# Patient Record
Sex: Male | Born: 1975 | Race: White | Hispanic: No | Smoking: Current every day smoker
Health system: Southern US, Community
[De-identification: ages and names within clinical notes are randomized; demographics above are authoritative.]

## PROBLEM LIST (undated history)

## (undated) DIAGNOSIS — M1990 Unspecified osteoarthritis, unspecified site: Secondary | ICD-10-CM

## (undated) HISTORY — DX: Unspecified osteoarthritis, unspecified site: M19.90

---

## 1998-09-05 ENCOUNTER — Encounter: Payer: Self-pay | Admitting: *Deleted

## 1998-09-05 ENCOUNTER — Emergency Department (HOSPITAL_COMMUNITY): Admission: EM | Admit: 1998-09-05 | Discharge: 1998-09-05 | Payer: Self-pay | Admitting: *Deleted

## 2000-08-21 ENCOUNTER — Emergency Department (HOSPITAL_COMMUNITY): Admission: EM | Admit: 2000-08-21 | Discharge: 2000-08-21 | Payer: Self-pay | Admitting: Emergency Medicine

## 2009-09-24 ENCOUNTER — Emergency Department (HOSPITAL_COMMUNITY): Admission: EM | Admit: 2009-09-24 | Discharge: 2009-09-24 | Payer: Self-pay | Admitting: Emergency Medicine

## 2017-10-27 NOTE — Progress Notes (Deleted)
   Subjective: WU:JWJXBJYNWCC:establish care, *** HPI: Anthony BrunnerJohnny Richardson is a 42 y.o. male presenting to clinic today for:  1. ***  No past medical history on file. *** The histories are not reviewed yet. Please review them in the "History" navigator section and refresh this SmartLink. Social History   Socioeconomic History  . Marital status: Not on file    Spouse name: Not on file  . Number of children: Not on file  . Years of education: Not on file  . Highest education level: Not on file  Social Needs  . Financial resource strain: Not on file  . Food insecurity - worry: Not on file  . Food insecurity - inability: Not on file  . Transportation needs - medical: Not on file  . Transportation needs - non-medical: Not on file  Occupational History  . Not on file  Tobacco Use  . Smoking status: Not on file  Substance and Sexual Activity  . Alcohol use: Not on file  . Drug use: Not on file  . Sexual activity: Not on file  Other Topics Concern  . Not on file  Social History Narrative  . Not on file   No outpatient medications have been marked as taking for the 10/29/17 encounter (Appointment) with Raliegh IpGottschalk, Jered Heiny M, DO.   No family history on file. Allergies not on file   Health Maintenance: ***  Flu Vaccine: {YES/NO/WILD GNFAO:13086}CARDS:18581}  Tdap Vaccine: {YES/NO/WILD VHQIO:96295}CARDS:18581}  - every 4297yrs - (<3 lifetime doses or unknown): all wounds -- look up need for Tetanus IG - (>=3 lifetime doses): clean/minor wound if >497yrs from previous; all other wounds if >3944yrs from previous Zoster Vaccine: {YES/NO/WILD CARDS:18581} (those >50yo, once) Pneumonia Vaccine: {YES/NO/WILD MWUXL:24401}CARDS:18581} (those w/ risk factors) - (<2120yr) Both: Immunocompromised, cochlear implant, CSF leak, asplenic, sickle cell, Chronic Renal Failure - (<1420yr) PPSV-23 only: Heart dz, lung disease, DM, tobacco abuse, alcoholism, cirrhosis/liver disease. - (>6920yr): PPSV13 then PPSV23 in 6-12mths;  - (>6320yr): repeat PPSV23 once if  pt received prior to 40yo and 144yrs have passed  ROS: Per HPI  Objective: Office vital signs reviewed. There were no vitals taken for this visit.  Physical Examination:  General: Awake, alert, *** nourished, No acute distress HEENT: Normal    Neck: No masses palpated. No lymphadenopathy    Ears: Tympanic membranes intact, normal light reflex, no erythema, no bulging    Eyes: PERRLA, extraocular movement in tact, sclera ***    Nose: nasal turbinates moist, *** nasal discharge    Throat: moist mucus membranes, no erythema, *** tonsillar exudate.  Airway is patent Cardio: regular rate and rhythm, S1S2 heard, no murmurs appreciated Pulm: clear to auscultation bilaterally, no wheezes, rhonchi or rales; normal work of breathing on room air GI: soft, non-tender, non-distended, bowel sounds present x4, no hepatomegaly, no splenomegaly, no masses GU: external vaginal tissue ***, cervix ***, *** punctate lesions on cervix appreciated, *** discharge from cervical os, *** bleeding, *** cervical motion tenderness, *** abdominal/ adnexal masses Extremities: warm, well perfused, No edema, cyanosis or clubbing; +*** pulses bilaterally MSK: *** gait and *** station Skin: dry; intact; no rashes or lesions Neuro: *** Strength and light touch sensation grossly intact, *** DTRs ***/4  Assessment/ Plan: 42 y.o. male   No problem-specific Assessment & Plan notes found for this encounter.   Raliegh IpAshly M Torey Reinard, DO [facility] [phone]

## 2017-10-29 ENCOUNTER — Ambulatory Visit: Payer: BLUE CROSS/BLUE SHIELD | Admitting: Family Medicine

## 2017-10-29 ENCOUNTER — Ambulatory Visit: Payer: Self-pay | Admitting: Family Medicine

## 2017-10-29 ENCOUNTER — Encounter: Payer: Self-pay | Admitting: Family Medicine

## 2017-10-29 VITALS — BP 134/90 | HR 87 | Temp 97.8°F | Ht 71.0 in | Wt 188.0 lb

## 2017-10-29 DIAGNOSIS — Z7689 Persons encountering health services in other specified circumstances: Secondary | ICD-10-CM

## 2017-10-29 DIAGNOSIS — M1711 Unilateral primary osteoarthritis, right knee: Secondary | ICD-10-CM | POA: Diagnosis not present

## 2017-10-29 DIAGNOSIS — M25551 Pain in right hip: Secondary | ICD-10-CM

## 2017-10-29 DIAGNOSIS — Z720 Tobacco use: Secondary | ICD-10-CM | POA: Insufficient documentation

## 2017-10-29 DIAGNOSIS — G479 Sleep disorder, unspecified: Secondary | ICD-10-CM | POA: Diagnosis not present

## 2017-10-29 DIAGNOSIS — Z23 Encounter for immunization: Secondary | ICD-10-CM | POA: Diagnosis not present

## 2017-10-29 MED ORDER — TRAZODONE HCL 50 MG PO TABS: 25.0000 mg | ORAL_TABLET | Freq: Every evening | ORAL | 0 refills | Status: DC | PRN

## 2017-10-29 MED ORDER — NAPROXEN 500 MG PO TABS: 500.0000 mg | ORAL_TABLET | Freq: Two times a day (BID) | ORAL | 0 refills | Status: DC

## 2017-10-29 NOTE — Assessment & Plan Note (Signed)
We will plan to initiate Chantix after he has been evaluated by sleep medicine.

## 2017-10-29 NOTE — Progress Notes (Signed)
Subjective: JX:BJYNWGNFACC:establish care, RLE pain HPI: Anthony Richardson is a 42 y.o. male presenting to clinic today for:  1. RLE pain Patient [facility] for aute on chronic Right sided hip and knee pain.  He notes that this has been present for 1 year.  It got worse over the last week.  He went to ED on Friday and was evaluated, had xrays done and was told he had OA of right knee and hip.  He was discharged with Norco and Naproxen.  He reports that pain has gotten a lot better.  He is now able to walk around without pain.  He notes that pain is still present in right hip.  He reports that pain feels like a stabbing pain in his knee and a grinding pain in the right hip.  No h/o injury to that area.  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  He notes pain is most severe in the morning.  Tolerating medication without difficulty.  2. Sleep difficulty Patient reports long-standing history of sleep difficulty.  He notes that he was previously prescribed trazodone which did help for about a month.  He was later switched to Ambien which was not helpful at all.  He reports difficulty falling asleep and staying asleep, citing that he gets may be about 3 hours of sleep per night.  He reports daytime sleepiness.  He denies nocturia, snoring, pauses in breathing.  He does drink 1-2 beers per night.  Avoids television and other blue light activities prior to bedtime.  No significant anxiety type symptoms.  3.  Tobacco use disorder Patient reports that he smokes about a pack per day and has done so for about 20 years.  Denies unplanned weight loss, hemoptysis, shortness of breath.  He notes that he was able to stop smoking for about 6 months but resumed and has not been able to stop again.  He has used nicotine patches, nicotine gum in the past.  He has never used Wellbutrin or Chantix in the past.  He is interested in quitting.  Past Medical  History:  Diagnosis Date  . Arthritis    History reviewed. No pertinent surgical history. Social History   Socioeconomic History  . Marital status: Divorced    Spouse name: Not on file  . Number of children: Not on file  . Years of education: Not on file  . Highest education level: Not on file  Social Needs  . Financial resource strain: Not on file  . Food insecurity - worry: Not on file  . Food insecurity - inability: Not on file  . Transportation needs - medical: Not on file  . Transportation needs - non-medical: Not on file  Occupational History  . Not on file  Tobacco Use  . Smoking status: Current Every Day Smoker    Packs/day: 1.00    Years: 15.00    Pack years: 15.00    Types: Cigarettes  . Smokeless tobacco: Never Used  Substance and Sexual Activity  . Alcohol use: Yes    Alcohol/week: 4.8 oz    Types: 8 Cans of beer per week  . Drug use: No  . Sexual activity: Not on file  Other Topics Concern  . Not on file  Social History Narrative  . Not on file   No outpatient medications have been marked as taking for the 10/29/17 encounter (Office Visit)  with Raliegh Ip, DO.   Family History  Problem Relation Age of Onset  . Arthritis Mother   . Cancer Father 55       penile  . ADD / ADHD Daughter   . ADD / ADHD Son   . Dementia Maternal Grandmother   . Heart attack Maternal Grandfather 50       x3, died in 11s  . Alcohol abuse Maternal Grandfather   . Dementia Paternal Grandmother   . COPD Paternal Grandfather    No Known Allergies   Health Maintenance: Flu Vaccine: yes ; Tdap Vaccine: yes    ROS: Per HPI  Objective: Office vital signs reviewed. BP 134/90   Pulse 87   Temp 97.8 F (36.6 C) (Oral)   Ht 5\' 11"  (1.803 m)   Wt 188 lb (85.3 kg)   BMI 26.22 kg/m   Physical Examination:  General: Awake, alert, No acute distress HEENT: Normal    Eyes: PERRLA, extraocular movement in tact, sclera white    Throat: moist mucus membranes, poor  dentition Cardio: regular rate and rhythm, S1S2 heard, no murmurs appreciated Pulm: clear to auscultation bilaterally, no wheezes, rhonchi or rales; normal work of breathing on room air MSK: Ambulates independently.  Gait normal.  Right hip: Patient has limited active range of motion in internal and external rotation of the hip.  No tenderness to palpation over the greater trochanter. + Pain with FABER.   Right knee: Patient has full, painless active range of motion in all planes.  Mild tenderness to palpation along the entire joint line.  No effusion appreciated.  No palpable bony abnormalities.  No palpable masses or abnormalities within the posterior popliteal fossa.  No ligamentous laxity. Neuro: 5/5 lower extremity strength.  Light touch sensation grossly intact.  No focal neurologic deficits  Assessment/ Plan: 42 y.o. male   Sleep difficulties Seems to have fairly good sleep hygiene.  He does drink beers before bedtime which may be contributing.  Trazodone has helped some in the past, but patient never really got more than 4 5 hours of sleep per night.  Body habitus does not suggest OSA, nor does patient endorse any symptoms or signs of OSA.  We will start this at a low dose.  I did recommend that he not drink alcohol while on this medication.  We will send him for sleep study to further evaluate.  Right hip pain Recently evaluated at [facility] emergency department.  X-ray report did not demonstrate any acute bony abnormalities.  Films were not available for personal review.  His physical exam was remarkable for pain with external rotation of the right hip. ?  Labral pathology versus impingement.  Okay to continue naproxen twice daily as needed for now.  Will refer to orthopedist for further evaluation and management.  Consider intra-articular injections versus surgery if clinically appropriate.  Primary osteoarthritis of right knee X-ray from [facility] emergency department with  notes of tricompartment degenerative changes within the right knee.  Imaging studies not available for personal review.  Will refer to orthopedics for further evaluation and management.  For now, continue naproxen twice daily as needed.  Tobacco abuse We will plan to initiate Chantix after he has been evaluated by sleep medicine.   Raliegh Ip, DO [facility] [phone]

## 2017-10-29 NOTE — Assessment & Plan Note (Signed)
Seems to have fairly good sleep hygiene.  He does drink beers before bedtime which may be contributing.  Trazodone has helped some in the past, but patient never really got more than 4 5 hours of sleep per night.  Body habitus does not suggest OSA, nor does patient endorse any symptoms or signs of OSA.  We will start this at a low dose.  I did recommend that he not drink alcohol while on this medication.  We will send him for sleep study to further evaluate.

## 2017-10-29 NOTE — Patient Instructions (Addendum)
Plan to see sleep specialist.  Pending their assessment, we will discuss starting Chantix. Try and self wean.  I have sent in Trazodone for help with sleep.  Do not drink with this medication.  I have referred you to Orthopedist for your hip/ knee.  I have refilled the Naproxen.  Take this with food.  Remember to bring your labs by to scan into your chart.  Insomnia Insomnia is a sleep disorder that makes it difficult to fall asleep or to stay asleep. Insomnia can cause tiredness (fatigue), low energy, difficulty concentrating, mood swings, and poor performance at work or school. There are three different ways to classify insomnia:  Difficulty falling asleep.  Difficulty staying asleep.  Waking up too early in the morning.  Any type of insomnia can be long-term (chronic) or short-term (acute). Both are common. Short-term insomnia usually lasts for three months or less. Chronic insomnia occurs at least three times a week for longer than three months. What are the causes? Insomnia may be caused by another condition, situation, or substance, such as:  Anxiety.  Certain medicines.  Gastroesophageal reflux disease (GERD) or other gastrointestinal conditions.  Asthma or other breathing conditions.  Restless legs syndrome, sleep apnea, or other sleep disorders.  Chronic pain.  Menopause. This may include hot flashes.  Stroke.  Abuse of alcohol, tobacco, or illegal drugs.  Depression.  Caffeine.  Neurological disorders, such as Alzheimer disease.  An overactive thyroid (hyperthyroidism).  The cause of insomnia may not be known. What increases the risk? Risk factors for insomnia include:  Gender. Women are more commonly affected than men.  Age. Insomnia is more common as you get older.  Stress. This may involve your professional or personal life.  Income. Insomnia is more common in people with lower income.  Lack of exercise.  Irregular work schedule or night  shifts.  Traveling between different time zones.  What are the signs or symptoms? If you have insomnia, trouble falling asleep or trouble staying asleep is the main symptom. This may lead to other symptoms, such as:  Feeling fatigued.  Feeling nervous about going to sleep.  Not feeling rested in the morning.  Having trouble concentrating.  Feeling irritable, anxious, or depressed.  How is this treated? Treatment for insomnia depends on the cause. If your insomnia is caused by an underlying condition, treatment will focus on addressing the condition. Treatment may also include:  Medicines to help you sleep.  Counseling or therapy.  Lifestyle adjustments.  Follow these instructions at home:  Take medicines only as directed by your health care provider.  Keep regular sleeping and waking hours. Avoid naps.  Keep a sleep diary to help you and your health care provider figure out what could be causing your insomnia. Include: ? When you sleep. ? When you wake up during the night. ? How well you sleep. ? How rested you feel the next day. ? Any side effects of medicines you are taking. ? What you eat and drink.  Make your bedroom a comfortable place where it is easy to fall asleep: ? Put up shades or special blackout curtains to block light from outside. ? Use a white noise machine to block noise. ? Keep the temperature cool.  Exercise regularly as directed by your health care provider. Avoid exercising right before bedtime.  Use relaxation techniques to manage stress. Ask your health care provider to suggest some techniques that may work well for you. These may include: ? Breathing exercises. ?  Routines to release muscle tension. ? Visualizing peaceful scenes.  Cut back on alcohol, caffeinated beverages, and cigarettes, especially close to bedtime. These can disrupt your sleep.  Do not overeat or eat spicy foods right before bedtime. This can lead to digestive discomfort  that can make it hard for you to sleep.  Limit screen use before bedtime. This includes: ? Watching TV. ? Using your smartphone, tablet, and computer.  Stick to a routine. This can help you fall asleep faster. Try to do a quiet activity, brush your teeth, and go to bed at the same time each night.  Get out of bed if you are still awake after 15 minutes of trying to sleep. Keep the lights down, but try reading or doing a quiet activity. When you feel sleepy, go back to bed.  Make sure that you drive carefully. Avoid driving if you feel very sleepy.  Keep all follow-up appointments as directed by your health care provider. This is important. Contact a health care provider if:  You are tired throughout the day or have trouble in your daily routine due to sleepiness.  You continue to have sleep problems or your sleep problems get worse. Get help right away if:  You have serious thoughts about hurting yourself or someone else. This information is not intended to replace advice given to you by your health care provider. Make sure you discuss any questions you have with your health care provider. Document Released: 10/06/2000 Document Revised: 03/10/2016 Document Reviewed: 07/10/2014 Elsevier Interactive Patient Education  [employer].

## 2017-10-29 NOTE — Assessment & Plan Note (Signed)
[facility] with notes of tricompartment degenerative changes within the right knee.  Imaging studies not available for personal review.  Will refer to orthopedics for further evaluation and management.  For now, continue naproxen twice daily as needed.

## 2017-10-29 NOTE — Assessment & Plan Note (Addendum)
[facility].  X-ray report did not demonstrate any acute bony abnormalities.  Films were not available for personal review.  His physical exam was remarkable for pain with external rotation of the right hip. ?  Labral pathology versus impingement.  Okay to continue naproxen twice daily as needed for now.  Will refer to orthopedist for further evaluation and management.  Consider intra-articular injections versus surgery if clinically appropriate.

## 2017-11-22 ENCOUNTER — Ambulatory Visit (INDEPENDENT_AMBULATORY_CARE_PROVIDER_SITE_OTHER): Payer: BLUE CROSS/BLUE SHIELD | Admitting: Orthopaedic Surgery

## 2017-11-23 ENCOUNTER — Encounter (INDEPENDENT_AMBULATORY_CARE_PROVIDER_SITE_OTHER): Payer: Self-pay | Admitting: Orthopaedic Surgery

## 2017-11-23 ENCOUNTER — Ambulatory Visit (INDEPENDENT_AMBULATORY_CARE_PROVIDER_SITE_OTHER): Payer: BLUE CROSS/BLUE SHIELD

## 2017-11-23 ENCOUNTER — Ambulatory Visit (INDEPENDENT_AMBULATORY_CARE_PROVIDER_SITE_OTHER): Payer: BLUE CROSS/BLUE SHIELD | Admitting: Orthopaedic Surgery

## 2017-11-23 ENCOUNTER — Other Ambulatory Visit (INDEPENDENT_AMBULATORY_CARE_PROVIDER_SITE_OTHER): Payer: Self-pay

## 2017-11-23 VITALS — BP 147/85 | HR 94 | Ht 71.0 in | Wt 188.0 lb

## 2017-11-23 DIAGNOSIS — M25551 Pain in right hip: Secondary | ICD-10-CM | POA: Diagnosis not present

## 2017-11-23 DIAGNOSIS — G8929 Other chronic pain: Secondary | ICD-10-CM | POA: Diagnosis not present

## 2017-11-23 DIAGNOSIS — M2550 Pain in unspecified joint: Secondary | ICD-10-CM

## 2017-11-23 DIAGNOSIS — M545 Low back pain: Secondary | ICD-10-CM | POA: Diagnosis not present

## 2017-11-23 NOTE — Progress Notes (Signed)
Office Visit Note/orthopedic consultation   Patient: Anthony Richardson           Date of Birth: 03-01-1976           MRN: [id number] Visit Date: 11/23/2017              Requested by: Raliegh IpGottschalk, Ashly M, DO [address] [place], [place] [id number] PCP: Raliegh IpGottschalk, Ashly M, DO   Assessment & Plan: Visit Diagnoses:  1. Chronic right-sided low back pain, with sciatica presence unspecified   2. Right hip pain     Plan: I discussed with the patient is difficult to determine what exact problem is.  We will obtain an arthritis panel today check him back again in 6 days For review of lab work.  Place him on a prednisone Dosepak.  He may have some hip tendinitis on the right side or possibly some lumbar pathology but several findings on his physical exam suggest neither are the problem.  He can continue the medications he is taking.  I appreciate the opportunity to see him in consultation Follow-Up Instructions: Return in about 6 days (around 11/29/2017).   Orders:  Orders Placed This Encounter  Procedures  . XR Lumbar Spine 2-3 Views   No orders of the defined types were placed in this encounter.     Procedures: No procedures performed   Clinical Data: No additional findings.   Subjective: Chief Complaint  Patient presents with  . Right Leg - Pain    HPI 42 year old male with several month history of right anterior groin pain right knee pain and some pain in his great toe just in the morning.  He states his work capacity is significantly gone down.  He is here for consultation at the request of Dr. Nadine CountsGottschalk.  Patient been treated with tramadol, Flexeril, meloxicam.  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  He states he has had some back pain off and on.  He ambulates with a limp and points to the right anterior groin where he is having pain.  It bothers him at night he said difficulty sleeping.  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  Review of Systems positive for mild knee degenerative changes on recent knee x-ray, tobacco use, sleep difficulty right hip pain without history of acute injury.  Negative for rheumatologic conditions.   Objective: Vital Signs: BP (!) 147/85   Pulse 94   Ht 5\' 11"  (1.803 m)   Wt 188 lb (85.3 kg)   BMI 26.22 kg/m   Physical Exam  Constitutional: He is oriented to person, place, and time. He appears well-developed and well-nourished.  HENT:  Head: Normocephalic and atraumatic.  Eyes: EOM are normal. Pupils are equal, round, and reactive to light.  Neck: No tracheal deviation present. No thyromegaly present.  Cardiovascular: Normal rate.  Pulmonary/Chest: Effort normal. He has no wheezes.  Abdominal: Soft. Bowel sounds are normal.  Neurological: He is alert and oriented to person, place, and time.  Skin: Skin is warm and dry. Capillary refill takes less than 2 seconds.  Psychiatric: He has a normal mood and affect. His behavior is normal. Judgment and thought content normal.    Ortho Exam patient has negative straight leg raising 90 degrees and in the exam room sitting on the exam table he keeps his knee extended.  He complains of some pain with supine straight leg raising on  the right hip.  He has some resistance with attempts at internal and external rotation but can internally rotate 30 degrees right hip with pain Rotate 45 degrees with pain.  Similar findings in the supine position with hips flexed.  No quad or calf atrophy no abductor weakness no sensory deficit.  Knee jerk is 3+ and ankle jerk is trace and symmetrical.  Anterior tib EHL gastrocsoleus are strong.  Distal pulses are intact.  Peroneals posterior tib are strong.  He has tenderness with palpation lumbar spine L4-5 and L5-S1 interspace and spinous processes.  Some sciatic notch tenderness on the right.  No inguinal lymphadenopathy.  No hip flexion weakness.  Specialty Comments:  No  specialty comments available.  Imaging: Xr Lumbar Spine 2-3 Views  Result Date: 11/23/2017 AP lateral lumbar spine x-rays demonstrate normal anatomy negative for acute changes.  Maintained disc space height.  No curvature. Impression:  normal lumbar spine x-rays.    PMFS History: Patient Active Problem List   Diagnosis Date Noted  . Tobacco abuse 10/29/2017  . Sleep difficulties 10/29/2017  . Primary osteoarthritis of right knee 10/29/2017  . Right hip pain 10/29/2017   Past Medical History:  Diagnosis Date  . Arthritis     Family History  Problem Relation Age of Onset  . Arthritis Mother   . Cancer Father 55       penile  . ADD / ADHD Daughter   . ADD / ADHD Son   . Dementia Maternal Grandmother   . Heart attack Maternal Grandfather 50       x3, died in 58s  . Alcohol abuse Maternal Grandfather   . Dementia Paternal Grandmother   . COPD Paternal Grandfather     No past surgical history on file. Social History   Occupational History  . Not on file  Tobacco Use  . Smoking status: Current Every Day Smoker    Packs/day: 1.00    Years: 15.00    Pack years: 15.00    Types: Cigarettes  . Smokeless tobacco: Never Used  Substance and Sexual Activity  . Alcohol use: Yes    Alcohol/week: 4.8 oz    Types: 8 Cans of beer per week  . Drug use: No  . Sexual activity: Yes    Birth control/protection: Condom

## 2017-11-26 LAB — SEDIMENTATION RATE: SED RATE: 2 mm/h (ref 0–15)

## 2017-11-26 LAB — RHEUMATOID FACTOR

## 2017-11-26 LAB — URIC ACID: URIC ACID, SERUM: 5.2 mg/dL (ref 4.0–8.0)

## 2017-11-26 LAB — ANA: Anti Nuclear Antibody(ANA): NEGATIVE

## 2017-11-29 ENCOUNTER — Ambulatory Visit (INDEPENDENT_AMBULATORY_CARE_PROVIDER_SITE_OTHER): Payer: BLUE CROSS/BLUE SHIELD | Admitting: Surgery

## 2017-12-17 ENCOUNTER — Other Ambulatory Visit: Payer: Self-pay | Admitting: Family Medicine

## 2017-12-19 ENCOUNTER — Ambulatory Visit: Payer: BLUE CROSS/BLUE SHIELD | Admitting: Family Medicine

## 2017-12-19 ENCOUNTER — Encounter: Payer: Self-pay | Admitting: Family Medicine

## 2017-12-19 VITALS — BP 138/88 | HR 97 | Temp 97.4°F | Ht 71.0 in | Wt 182.0 lb

## 2017-12-19 DIAGNOSIS — M5441 Lumbago with sciatica, right side: Secondary | ICD-10-CM | POA: Diagnosis not present

## 2017-12-19 DIAGNOSIS — G8929 Other chronic pain: Secondary | ICD-10-CM | POA: Diagnosis not present

## 2017-12-19 MED ORDER — CYCLOBENZAPRINE HCL 10 MG PO TABS: 5.0000 mg | ORAL_TABLET | Freq: Three times a day (TID) | ORAL | 0 refills | Status: DC | PRN

## 2017-12-19 MED ORDER — NAPROXEN 500 MG PO TABS: 500.0000 mg | ORAL_TABLET | Freq: Two times a day (BID) | ORAL | 0 refills | Status: DC

## 2017-12-19 MED ORDER — TRAMADOL HCL 50 MG PO TABS: 50.0000 mg | ORAL_TABLET | Freq: Two times a day (BID) | ORAL | 0 refills | Status: DC | PRN

## 2017-12-19 NOTE — Progress Notes (Signed)
Subjective: CC: Right hip/ knee pain HPI: Anthony Richardson is a 42 y.o. male presenting to clinic today for:  1. Right hip and knee pain Patient was seen by orthopedics on 11/23/2017.  He was scheduled to follow-up 1 week later but apparently no showed to the appointment.  He follows up today and notes that he was dissatisfied with his evaluation at the orthopedics office.  He notes that he continues to have right-sided hip and low back pain with radiation to the right knee.  He has intermittent numbness and tingling in the leg with walking.  Denies any urinary retention, fecal incontinence or saddle anesthesia.  He reports the pain was somewhat improved with the medications that were prescribed, which included tramadol, NSAID and Flexeril.  He wishes to establish with a new orthopedist.  ROS: Per HPI  Past Medical History:  Diagnosis Date  . Arthritis    No Known Allergies  Current Outpatient Medications:  .  cyclobenzaprine (FLEXERIL) 10 MG tablet, , Disp: , Rfl:  .  meloxicam (MOBIC) 15 MG tablet, , Disp: , Rfl:  .  naproxen (NAPROSYN) 500 MG tablet, Take 1 tablet (500 mg total) by mouth 2 (two) times daily with a meal. (Patient not taking: Reported on 11/23/2017), Disp: 30 tablet, Rfl: 0 .  traMADol (ULTRAM) 50 MG tablet, , Disp: , Rfl:  .  traZODone (DESYREL) 50 MG tablet, TAKE 1/2 TO 1 (ONE-HALF TO ONE) TABLET BY MOUTH AT BEDTIME AS NEEDED FOR SLEEP, Disp: 30 tablet, Rfl: 0 Social History   Socioeconomic History  . Marital status: Divorced    Spouse name: Not on file  . Number of children: Not on file  . Years of education: Not on file  . Highest education level: Not on file  Social Needs  . Financial resource strain: Not on file  . Food insecurity - worry: Not on file  . Food insecurity - inability: Not on file  . Transportation needs - medical: Not on file  . Transportation needs - non-medical: Not on file  Occupational History  . Not on file  Tobacco Use  . Smoking  status: Current Every Day Smoker    Packs/day: 1.00    Years: 15.00    Pack years: 15.00    Types: Cigarettes  . Smokeless tobacco: Never Used  Substance and Sexual Activity  . Alcohol use: Yes    Alcohol/week: 4.8 oz    Types: 8 Cans of beer per week  . Drug use: No  . Sexual activity: Yes    Birth control/protection: Condom  Other Topics Concern  . Not on file  Social History Narrative  . Not on file   Family History  Problem Relation Age of Onset  . Arthritis Mother   . Cancer Father 55       penile  . ADD / ADHD Daughter   . ADD / ADHD Son   . Dementia Maternal Grandmother   . Heart attack Maternal Grandfather 50       x3, died in 4250s  . Alcohol abuse Maternal Grandfather   . Dementia Paternal Grandmother   . COPD Paternal Grandfather    Objective: Office vital signs reviewed. BP 138/88   Pulse 97   Temp (!) 97.4 F (36.3 C) (Oral)   Ht 5\' 11"  (1.803 m)   Wt 182 lb (82.6 kg)   BMI 25.38 kg/m   Physical Examination:  General: Awake, alert, well nourished, No acute distress 2Extremities: warm, well perfused, No  edema, cyanosis or clubbing; +2 pulses bilaterally MSK: mildly antalgic gait and does not put weight on RLE during exam but ambulates independently on exit from exam room.  Spine: Patient has paraspinal tenderness on the right near the lumbosacral junction.  There is mild increased tonicity of the paraspinal muscles in this area.  No midline tenderness to palpation.  He does have pain in his right buttock with straight leg raise on the right.  Hip: Pain with FABER and FADIR on the right. Skin: dry; intact; no rashes or lesions Neuro: 5/5 LLE Strength; 4/5 RLE strength. Light touch sensation dulled on the right  Assessment/ Plan: 42 y.o. male   1. Chronic right-sided low back pain with right-sided sciatica Has been ongoing since 2018.  He had imaging at [facility] emergency department which demonstrated OA of the right knee and hip.  Per his report,  he had degenerative changes in the back noted on imaging studies at the orthopedist office recently.  He would like a second opinion from another orthopedist.  New referral has been placed.  A small quantity of tramadol, naproxen and Flexeril provided.  Indications for use discussed with the patient.  Red flag symptoms reviewed with patient.  Return precautions and reasons for emergent evaluation in the emergency department discussed with patient.  He will follow-up as needed. - Ambulatory referral to Orthopedic Surgery   Raliegh Ip, DO [facility] [phone]

## 2017-12-19 NOTE — Patient Instructions (Signed)
I have placed a new referral to orthopedics.  You have been giving a short supply of tramadol, Flexeril and naproxen to use for your symptoms.  I recommend that you use the naproxen prior to the other 2 as the tramadol and Flexeril are sedating.  Do not operate heavy machinery or drive while taking Flexeril or tramadol.  As we discussed, tramadol and Flexeril are not good options for long-term pain control and will not be refilled.  Please make sure that you are using this medication sparingly until you are seen by orthopedics.  You have prescribed a nonsteroidal anti-inflammatory drug (NSAID) today. This will help with ear pain and inflammation. Please do not take any other NSAIDs (ibuprofen/Motrin/Advil, naproxen/Aleve, meloxicam/Mobic, Voltaren/diclofenac). Please make sure to eat a meal when taking this medication.   Caution:  If you have a history of acid reflux/indigestion, I recommend that you take an antacid (such as Prilosec, Prevacid) daily while on the NSAID.  If you have a history of bleeding disorder, gastric ulcer, are on a blood thinner (like warfarin/Coumadin, Xarelto, Eliquis, etc) please do not take NSAID.  If you have ever had a heart attack, you should not take NSAIDs.   Back Pain, Adult Many adults have back pain from time to time. Common causes of back pain include:  A strained muscle or ligament.  Wear and tear (degeneration) of the spinal disks.  Arthritis.  A hit to the back.  Back pain can be short-lived (acute) or last a long time (chronic). A physical exam, lab tests, and imaging studies may be done to find the cause of your pain. Follow these instructions at home: Managing pain and stiffness  Take over-the-counter and prescription medicines only as told by your health care provider.  If directed, apply heat to the affected area as often as told by your health care provider. Use the heat source that your health care provider recommends, such as a moist heat  pack or a heating pad. ? Place a towel between your skin and the heat source. ? Leave the heat on for 20-30 minutes. ? Remove the heat if your skin turns bright red. This is especially important if you are unable to feel pain, heat, or cold. You have a greater risk of getting burned.  If directed, apply ice to the injured area: ? Put ice in a plastic bag. ? Place a towel between your skin and the bag. ? Leave the ice on for 20 minutes, 2-3 times a day for the first 2-3 days. Activity  Do not stay in bed. Resting more than 1-2 days can delay your recovery.  Take short walks on even surfaces as soon as you are able. Try to increase the length of time you walk each day.  Do not sit, drive, or stand in one place for more than 30 minutes at a time. Sitting or standing for long periods of time can put stress on your back.  Use proper lifting techniques. When you bend and lift, use positions that put less stress on your back: ? TerryBend your knees. ? Keep the load close to your body. ? Avoid twisting.  Exercise regularly as told by your health care provider. Exercising will help your back heal faster. This also helps prevent back injuries by keeping muscles strong and flexible.  Your health care provider may recommend that you see a physical therapist. This person can help you come up with a safe exercise program. Do any exercises as told by  your physical therapist. Lifestyle  Maintain a healthy weight. Extra weight puts stress on your back and makes it difficult to have good posture.  Avoid activities or situations that make you feel anxious or stressed. Learn ways to manage anxiety and stress. One way to manage stress is through exercise. Stress and anxiety increase muscle tension and can make back pain worse. General instructions  Sleep on a firm mattress in a comfortable position. Try lying on your side with your knees slightly bent. If you lie on your back, put a pillow under your  knees.  Follow your treatment plan as told by your health care provider. This may include: ? Cognitive or behavioral therapy. ? Acupuncture or massage therapy. ? Meditation or yoga. Contact a health care provider if:  You have pain that is not relieved with rest or medicine.  You have increasing pain going down into your legs or buttocks.  Your pain does not improve in 2 weeks.  You have pain at night.  You lose weight.  You have a fever or chills. Get help right away if:  You develop new bowel or bladder control problems.  You have unusual weakness or numbness in your arms or legs.  You develop nausea or vomiting.  You develop abdominal pain.  You feel faint. Summary  Many adults have back pain from time to time. A physical exam, lab tests, and imaging studies may be done to find the cause of your pain.  Use proper lifting techniques. When you bend and lift, use positions that put less stress on your back.  Take over-the-counter and prescription medicines and apply heat or ice as directed by your health care provider. This information is not intended to replace advice given to you by your health care provider. Make sure you discuss any questions you have with your health care provider. Document Released: 10/09/2005 Document Revised: 11/13/2016 Document Reviewed: 11/13/2016 Elsevier Interactive Patient Education  [employer].

## 2018-01-02 ENCOUNTER — Telehealth: Payer: Self-pay | Admitting: Orthopedic Surgery

## 2018-01-02 NOTE — Telephone Encounter (Signed)
Please review referral [facility] - notes and imaging for problem right sided sciatica; appears that patient has also seen Dr Ophelia CharterYates. Please advise.

## 2018-01-02 NOTE — Telephone Encounter (Signed)
I will see him one time make sure he knows that is a second opinion no medications will be prescribed and I do not do back surgery

## 2018-01-16 ENCOUNTER — Encounter: Payer: Self-pay | Admitting: Orthopedic Surgery

## 2018-01-16 NOTE — Telephone Encounter (Signed)
Noted. Updated referral, calling patient.

## 2019-02-18 ENCOUNTER — Other Ambulatory Visit: Payer: Self-pay

## 2019-02-18 ENCOUNTER — Emergency Department
Admission: EM | Admit: 2019-02-18 | Discharge: 2019-02-18 | Disposition: A | Discharge status: Home / Self Care | Payer: Self-pay | Attending: Emergency Medicine | Admitting: Emergency Medicine

## 2019-02-18 ENCOUNTER — Encounter: Payer: Self-pay | Admitting: Medical Oncology

## 2019-02-18 DIAGNOSIS — M5431 Sciatica, right side: Secondary | ICD-10-CM | POA: Insufficient documentation

## 2019-02-18 DIAGNOSIS — Y939 Activity, unspecified: Secondary | ICD-10-CM | POA: Insufficient documentation

## 2019-02-18 DIAGNOSIS — M7061 Trochanteric bursitis, right hip: Secondary | ICD-10-CM | POA: Insufficient documentation

## 2019-02-18 DIAGNOSIS — F17210 Nicotine dependence, cigarettes, uncomplicated: Secondary | ICD-10-CM | POA: Insufficient documentation

## 2019-02-18 MED ORDER — MELOXICAM 15 MG PO TABS: 15.0000 mg | ORAL_TABLET | Freq: Every day | ORAL | 0 refills | Status: AC

## 2019-02-18 MED ORDER — METHOCARBAMOL 500 MG PO TABS: 500.0000 mg | ORAL_TABLET | Freq: Four times a day (QID) | ORAL | 0 refills | Status: AC

## 2019-02-18 MED ORDER — KETOROLAC TROMETHAMINE 30 MG/ML IJ SOLN
30.0000 mg | Freq: Once | INTRAMUSCULAR | Status: AC
  Administered 2019-02-18: 30 mg via INTRAMUSCULAR
  Filled 2019-02-18: qty 1

## 2019-02-18 NOTE — ED Triage Notes (Signed)
Pt reports intermittent pain to right hip/thigh and lower back for over a year, pain worsened over past few days. Pt ambulatory, no known injury.

## 2019-02-18 NOTE — ED Notes (Signed)
See triage note  Presents with pain to right hip and lower back  States pian also moves into groin area  States pain has been going on for over a year   But has increased over the past 11/2 months   Denies recent injury or urinary sxs

## 2019-02-18 NOTE — ED Provider Notes (Signed)
[facility] Emergency Department Provider Note  ____________________________________________  Time seen: Approximately 5:34 PM  I have reviewed the triage vital signs and the nursing notes.   HISTORY  Chief Complaint Hip Pain and Back Pain    HPI Anthony Richardson is a 43 y.o. male who presents the emergency department with lower back and right hip pain.  Patient reports he has had intermittent lower back pain for the past 2 years.  He typically takes a dose or 2 of ibuprofen or Aleve and symptoms resolved.  Patient reports over the past month the symptoms have been worsening.  Now he is also developing right lateral hip pain.  No trauma precipitating this.  Patient does have a history of arthritis with degenerative disc disease and apparent lumbar radiculopathy in the past.  Patient denies any bowel bladder dysfunction, saddle anesthesia, paresthesias.  He does have intermittent mild radicular symptoms running down the right leg with pain and occasional "tingling sensation."  Patient is tried Aleve and ibuprofen with no relief at this time.  Patient denies any GI or urinary symptoms at this time.        Past Medical History:  Diagnosis Date  . Arthritis     Patient Active Problem List   Diagnosis Date Noted  . Tobacco abuse 10/29/2017  . Sleep difficulties 10/29/2017  . Primary osteoarthritis of right knee 10/29/2017  . Right hip pain 10/29/2017    History reviewed. No pertinent surgical history.  Prior to Admission medications   Medication Sig Start Date End Date Taking? Authorizing Provider  meloxicam (MOBIC) 15 MG tablet Take 1 tablet (15 mg total) by mouth daily. 02/18/19   Keylin Ferryman, Delorise Royals, PA-C  methocarbamol (ROBAXIN) 500 MG tablet Take 1 tablet (500 mg total) by mouth 4 (four) times daily. 02/18/19   Catlin Doria, Delorise Royals, PA-C    Allergies Patient has no known allergies.  Family History  Problem Relation Age of Onset  . Arthritis  Mother   . Cancer Father 55       penile  . ADD / ADHD Daughter   . ADD / ADHD Son   . Dementia Maternal Grandmother   . Heart attack Maternal Grandfather 50       x3, died in 51s  . Alcohol abuse Maternal Grandfather   . Dementia Paternal Grandmother   . COPD Paternal Grandfather     Social History Social History   Tobacco Use  . Smoking status: Current Every Day Smoker    Packs/day: 1.00    Years: 15.00    Pack years: 15.00    Types: Cigarettes  . Smokeless tobacco: Never Used  Substance Use Topics  . Alcohol use: Yes    Alcohol/week: 8.0 standard drinks    Types: 8 Cans of beer per week  . Drug use: No     Review of Systems  Constitutional: No fever/chills Eyes: No visual changes. No discharge ENT: No upper respiratory complaints. Cardiovascular: no chest pain. Respiratory: no cough. No SOB. Gastrointestinal: No abdominal pain.  No nausea, no vomiting.  No diarrhea.  No constipation. Genitourinary: Negative for dysuria. No hematuria Musculoskeletal: Positive for lower back pain and right hip pain. Skin: Negative for rash, abrasions, lacerations, ecchymosis. Neurological: Negative for headaches, focal weakness or numbness. 10-point ROS otherwise negative.  ____________________________________________   PHYSICAL EXAM:  VITAL SIGNS: ED Triage Vitals  Enc Vitals Group     BP 02/18/19 1618 132/84     Pulse Rate 02/18/19 1617 77  Resp 02/18/19 1617 18     Temp 02/18/19 1617 (!) 97.5 F (36.4 C)     Temp Source 02/18/19 1617 Oral     SpO2 02/18/19 1617 99 %     Weight 02/18/19 1617 186 lb (84.4 kg)     Height 02/18/19 1617 5\' 11"  (1.803 m)     Head Circumference --      Peak Flow --      Pain Score 02/18/19 1617 10     Pain Loc --      Pain Edu? --      Excl. in GC? --      Constitutional: Alert and oriented. Well appearing and in no acute distress. Eyes: Conjunctivae are normal. PERRL. EOMI. Head: Atraumatic. Neck: No stridor.     Cardiovascular: Normal rate, regular rhythm. Normal S1 and S2.  Good peripheral circulation. Respiratory: Normal respiratory effort without tachypnea or retractions. Lungs CTAB. Good air entry to the bases with no decreased or absent breath sounds. Gastrointestinal: Bowel sounds 4 quadrants. Soft and nontender to palpation. No guarding or rigidity. No palpable masses. No distention. No CVA tenderness. Musculoskeletal: Full range of motion to all extremities. No gross deformities appreciated.  Visualization of the lumbar spine and right hip reveals no gross abnormality.  Patient is mildly tender to palpation along the right-sided paraspinal muscle group and right-sided sciatic notch.  Negative straight leg raise bilaterally.  No midline tenderness.  Patient is tender to palpation over the trochanter region with no palpable abnormality.  No tenderness to palpation over the lower back or right hip.  Examination of the right knee and ankle is unremarkable.  Dorsalis pedis pulse intact distally.  Sensation intact and equal in all dermatomal distributions bilateral lower extremities. Neurologic:  Normal speech and language. No gross focal neurologic deficits are appreciated.  Skin:  Skin is warm, dry and intact. No rash noted. Psychiatric: Mood and affect are normal. Speech and behavior are normal. Patient exhibits appropriate insight and judgement.   ____________________________________________   LABS (all labs ordered are listed, but only abnormal results are displayed)  Labs Reviewed - No data to display ____________________________________________  EKG   ____________________________________________  RADIOLOGY   No results found.  ____________________________________________    PROCEDURES  Procedure(s) performed:    Procedures    Medications  ketorolac (TORADOL) 30 MG/ML injection 30 mg (30 mg Intramuscular Given 02/18/19 1739)      ____________________________________________   INITIAL IMPRESSION / ASSESSMENT AND PLAN / ED COURSE  Pertinent labs & imaging results that were available during my care of the patient were reviewed by me and considered in my medical decision making (see chart for details).  Review of the [place] CSRS was performed in accordance of the [facility] prior to dispensing any controlled drugs.           Patient's diagnosis is consistent with sciatica with trochanteric bursitis.  Patient presents emergency department lower back pain and right hip pain.  Patient does have a history of lower back pain with radicular symptoms from degenerative disc disease.  Patient is also experiencing increased hip pain.  On exam, findings are consistent with sciatica with trochanteric bursitis.  Patient is given a shot of Toradol in the emergency department.  Meloxicam and Norflex at home.  No indication for labs or imaging at this time.  Differential included bulging disc, ruptured disc, sciatica, fracture, dislocation, strain..  Patient will follow-up with orthopedics if necessary.  Patient is given ED precautions to return to  the ED for any worsening or new symptoms.     ____________________________________________  FINAL CLINICAL IMPRESSION(S) / ED DIAGNOSES  Final diagnoses:  Sciatica of right side  Trochanteric bursitis of right hip      NEW MEDICATIONS STARTED DURING THIS VISIT:  ED Discharge Orders         Ordered    meloxicam (MOBIC) 15 MG tablet  Daily     02/18/19 1742    methocarbamol (ROBAXIN) 500 MG tablet  4 times daily     02/18/19 1742              This chart was dictated using voice recognition software/Dragon. Despite best efforts to proofread, errors can occur which can change the meaning. Any change was purely unintentional.    Racheal Patches, PA-C 02/18/19 1807    Jeanmarie Plant, MD 02/18/19 2015

## 2019-06-28 ENCOUNTER — Emergency Department
Admission: EM | Admit: 2019-06-28 | Discharge: 2019-06-28 | Disposition: A | Discharge status: Home / Self Care | Payer: Self-pay | Attending: Emergency Medicine | Admitting: Emergency Medicine

## 2019-06-28 ENCOUNTER — Other Ambulatory Visit: Payer: Self-pay

## 2019-06-28 ENCOUNTER — Encounter: Payer: Self-pay | Admitting: Emergency Medicine

## 2019-06-28 ENCOUNTER — Emergency Department: Payer: Self-pay

## 2019-06-28 DIAGNOSIS — Z79899 Other long term (current) drug therapy: Secondary | ICD-10-CM | POA: Insufficient documentation

## 2019-06-28 DIAGNOSIS — R197 Diarrhea, unspecified: Secondary | ICD-10-CM | POA: Insufficient documentation

## 2019-06-28 DIAGNOSIS — R519 Headache, unspecified: Secondary | ICD-10-CM

## 2019-06-28 DIAGNOSIS — Z20828 Contact with and (suspected) exposure to other viral communicable diseases: Secondary | ICD-10-CM | POA: Insufficient documentation

## 2019-06-28 DIAGNOSIS — R5383 Other fatigue: Secondary | ICD-10-CM | POA: Insufficient documentation

## 2019-06-28 DIAGNOSIS — R531 Weakness: Secondary | ICD-10-CM | POA: Insufficient documentation

## 2019-06-28 DIAGNOSIS — F17210 Nicotine dependence, cigarettes, uncomplicated: Secondary | ICD-10-CM | POA: Insufficient documentation

## 2019-06-28 DIAGNOSIS — R51 Headache: Secondary | ICD-10-CM | POA: Insufficient documentation

## 2019-06-28 DIAGNOSIS — R6883 Chills (without fever): Secondary | ICD-10-CM | POA: Insufficient documentation

## 2019-06-28 LAB — LIPASE, BLOOD: Lipase: 27 U/L (ref 11–51)

## 2019-06-28 LAB — CBC
HCT: 45.1 % (ref 39.0–52.0)
Hemoglobin: 15 g/dL (ref 13.0–17.0)
MCH: 29.4 pg (ref 26.0–34.0)
MCHC: 33.3 g/dL (ref 30.0–36.0)
MCV: 88.4 fL (ref 80.0–100.0)
Platelets: 290 10*3/uL (ref 150–400)
RBC: 5.1 MIL/uL (ref 4.22–5.81)
RDW: 13.2 % (ref 11.5–15.5)
WBC: 11 10*3/uL — ABNORMAL HIGH (ref 4.0–10.5)
nRBC: 0 % (ref 0.0–0.2)

## 2019-06-28 LAB — URINALYSIS, COMPLETE (UACMP) WITH MICROSCOPIC
Bacteria, UA: NONE SEEN
Bilirubin Urine: NEGATIVE
Glucose, UA: NEGATIVE mg/dL
Hgb urine dipstick: NEGATIVE
Ketones, ur: NEGATIVE mg/dL
Leukocytes,Ua: NEGATIVE
Nitrite: NEGATIVE
Protein, ur: NEGATIVE mg/dL
Specific Gravity, Urine: 1.006 (ref 1.005–1.030)
Squamous Epithelial / HPF: NONE SEEN (ref 0–5)
pH: 7 (ref 5.0–8.0)

## 2019-06-28 LAB — COMPREHENSIVE METABOLIC PANEL
ALT: 42 U/L (ref 0–44)
AST: 25 U/L (ref 15–41)
Albumin: 4.2 g/dL (ref 3.5–5.0)
Alkaline Phosphatase: 79 U/L (ref 38–126)
Anion gap: 10 (ref 5–15)
BUN: 21 mg/dL — ABNORMAL HIGH (ref 6–20)
CO2: 24 mmol/L (ref 22–32)
Calcium: 9.1 mg/dL (ref 8.9–10.3)
Chloride: 104 mmol/L (ref 98–111)
Creatinine, Ser: 0.96 mg/dL (ref 0.61–1.24)
GFR calc Af Amer: >60 mL/min (ref >60)
GFR calc non Af Amer: >60 mL/min (ref >60)
Glucose, Bld: 86 mg/dL (ref 70–99)
Potassium: 4.7 mmol/L (ref 3.5–5.1)
Sodium: 138 mmol/L (ref 135–145)
Total Bilirubin: 0.7 mg/dL (ref 0.3–1.2)
Total Protein: 7.6 g/dL (ref 6.5–8.1)

## 2019-06-28 LAB — SARS CORONAVIRUS 2 BY RT PCR (HOSPITAL ORDER, PERFORMED IN ~~LOC~~ HOSPITAL LAB): SARS Coronavirus 2: NEGATIVE

## 2019-06-28 MED ORDER — METHYLPREDNISOLONE SODIUM SUCC 125 MG IJ SOLR
125.0000 mg | Freq: Once | INTRAMUSCULAR | Status: AC
  Administered 2019-06-28: 10:00:00 125 mg via INTRAVENOUS
  Filled 2019-06-28: qty 2

## 2019-06-28 MED ORDER — NAPROXEN 500 MG PO TABS: 500.0000 mg | ORAL_TABLET | Freq: Two times a day (BID) | ORAL | 0 refills | Status: AC

## 2019-06-28 MED ORDER — SODIUM CHLORIDE 0.9% FLUSH: 3.0000 mL | Freq: Once | INTRAVENOUS | Status: DC

## 2019-06-28 MED ORDER — METOCLOPRAMIDE HCL 5 MG/ML IJ SOLN
10.0000 mg | Freq: Once | INTRAMUSCULAR | Status: AC
  Administered 2019-06-28: 10:00:00 10 mg via INTRAVENOUS
  Filled 2019-06-28: qty 2

## 2019-06-28 MED ORDER — KETOROLAC TROMETHAMINE 30 MG/ML IJ SOLN
15.0000 mg | INTRAMUSCULAR | Status: AC
  Administered 2019-06-28: 10:00:00 15 mg via INTRAVENOUS
  Filled 2019-06-28: qty 1

## 2019-06-28 MED ORDER — DIPHENHYDRAMINE HCL 25 MG PO CAPS: 50.0000 mg | ORAL_CAPSULE | Freq: Four times a day (QID) | ORAL | 0 refills | Status: AC | PRN

## 2019-06-28 MED ORDER — DIPHENHYDRAMINE HCL 50 MG/ML IJ SOLN
25.0000 mg | Freq: Once | INTRAMUSCULAR | Status: AC
  Administered 2019-06-28: 25 mg via INTRAVENOUS
  Filled 2019-06-28: qty 1

## 2019-06-28 MED ORDER — SODIUM CHLORIDE 0.9 % IV BOLUS
1000.0000 mL | Freq: Once | INTRAVENOUS | Status: AC
  Administered 2019-06-28: 10:00:00 1000 mL via INTRAVENOUS

## 2019-06-28 MED ORDER — METOCLOPRAMIDE HCL 10 MG PO TABS: 10.0000 mg | ORAL_TABLET | Freq: Four times a day (QID) | ORAL | 0 refills | Status: AC | PRN

## 2019-06-28 NOTE — Discharge Instructions (Signed)
Your lab test, COVID test, and CT scan of the head were all unremarkable today.  Please stay well-hydrated and get plenty of sleep for the next few days until your symptoms have completely resolved.

## 2019-06-28 NOTE — ED Notes (Signed)
Pt states he is staying in rehab in [place]. This morning at around 230am was woken up with an intense headache that was all over, has lessened since this morning. Pt states vomiting 3 times, stopped at 7am. Pt also states diarrhea 10 times. Pt is weak/ fatigued. Denies fevers.

## 2019-06-28 NOTE — ED Notes (Signed)
Pt awake and states he feels better. States he is ready to go home. EDP informed.

## 2019-06-28 NOTE — ED Provider Notes (Signed)
[facility] Emergency Department Provider Note  ____________________________________________  Time seen: Approximately 9:51 AM  I have reviewed the triage vital signs and the nursing notes.   HISTORY  Chief Complaint Headache and Diarrhea    HPI Anthony Richardson is a 43 y.o. male with no significant past medical history who states that he woke up at about 2:30 AM with a severe bilateral headache, nonradiating, no associated vision changes weakness dizziness or change in balance or coordination.  No falls or trauma, no fevers.  He does report for the last 2 days developing some fatigue and intermittent chills.  Also reports some loose bowel movements for last 24 hours.  He works as a Production designer, theatre/television/film at a [employer].  Headache is constant and other symptoms are intermittent, no aggravating or alleviating factors.       Past Medical History:  Diagnosis Date  . Arthritis      Patient Active Problem List   Diagnosis Date Noted  . Tobacco abuse 10/29/2017  . Sleep difficulties 10/29/2017  . Primary osteoarthritis of right knee 10/29/2017  . Right hip pain 10/29/2017     History reviewed. No pertinent surgical history.   Prior to Admission medications   Medication Sig Start Date End Date Taking? Authorizing Provider  diphenhydrAMINE (BENADRYL) 25 mg capsule Take 2 capsules (50 mg total) by mouth every 6 (six) hours as needed. 06/28/19   Sharman Cheek, MD  meloxicam (MOBIC) 15 MG tablet Take 1 tablet (15 mg total) by mouth daily. 02/18/19   Cuthriell, Delorise Royals, PA-C  methocarbamol (ROBAXIN) 500 MG tablet Take 1 tablet (500 mg total) by mouth 4 (four) times daily. 02/18/19   Cuthriell, Delorise Royals, PA-C  metoCLOPramide (REGLAN) 10 MG tablet Take 1 tablet (10 mg total) by mouth every 6 (six) hours as needed. 06/28/19   Sharman Cheek, MD  naproxen (NAPROSYN) 500 MG tablet Take 1 tablet (500 mg total) by mouth 2 (two) times daily with a meal. 06/28/19    Sharman Cheek, MD     Allergies Patient has no known allergies.   Family History  Problem Relation Age of Onset  . Arthritis Mother   . Cancer Father 55       penile  . ADD / ADHD Daughter   . ADD / ADHD Son   . Dementia Maternal Grandmother   . Heart attack Maternal Grandfather 50       x3, died in 8s  . Alcohol abuse Maternal Grandfather   . Dementia Paternal Grandmother   . COPD Paternal Grandfather     Social History Social History   Tobacco Use  . Smoking status: Current Every Day Smoker    Packs/day: 1.00    Years: 15.00    Pack years: 15.00    Types: Cigarettes  . Smokeless tobacco: Never Used  Substance Use Topics  . Alcohol use: Yes    Alcohol/week: 8.0 standard drinks    Types: 8 Cans of beer per week    Comment: states that he does not drink anymore   . Drug use: Yes    Types: Methamphetamines    Comment: has not used in about 2 weeks.     Review of Systems  Constitutional:   No fever positive chills.  ENT:   No sore throat. No rhinorrhea. Cardiovascular:   No chest pain or syncope. Respiratory:   No dyspnea positive nonproductive cough. Gastrointestinal:   Negative for abdominal pain, positive vomiting and diarrhea.  Musculoskeletal: Chronic right  hip and knee pain All other systems reviewed and are negative except as documented above in ROS and HPI.  ____________________________________________   PHYSICAL EXAM:  VITAL SIGNS: ED Triage Vitals  Enc Vitals Group     BP 06/28/19 0747 (!) 146/104     Pulse Rate 06/28/19 0747 82     Resp 06/28/19 0747 16     Temp 06/28/19 0747 98.1 F (36.7 C)     Temp Source 06/28/19 0747 Oral     SpO2 06/28/19 0747 100 %     Weight 06/28/19 0748 182 lb (82.6 kg)     Height 06/28/19 0748 6' (1.829 m)     Head Circumference --      Peak Flow --      Pain Score 06/28/19 0748 6     Pain Loc --      Pain Edu? --      Excl. in GC? --     Vital signs reviewed, nursing assessments  reviewed.   Constitutional:   Alert and oriented. Non-toxic appearance. Eyes:   Conjunctivae are normal. EOMI. PERRL.  No nystagmus or APD ENT      Head:   Normocephalic and atraumatic.      Nose:   No congestion/rhinnorhea.       Mouth/Throat:   MMM, no pharyngeal erythema. No peritonsillar mass.  Poor dentition      Neck:   No meningismus. Full ROM. Hematological/Lymphatic/Immunilogical:   No cervical lymphadenopathy. Cardiovascular:   RRR. Symmetric bilateral radial and DP pulses.  No murmurs. Cap refill less than 2 seconds. Respiratory:   Normal respiratory effort without tachypnea/retractions.  Slight end expiratory wheezing diffusely Gastrointestinal:   Soft and nontender. Non distended. There is no CVA tenderness.  No rebound, rigidity, or guarding. Musculoskeletal:   Normal range of motion in all extremities. No joint effusions.  No lower extremity tenderness.  No edema. Neurologic:   Normal speech and language.  Cranial nerves III through XII intact No pronator drift, normal finger-to-nose Normal gait Motor grossly intact. No acute focal neurologic deficits are appreciated.  Skin:    Skin is warm, dry and intact. No rash noted.  No petechiae, purpura, or bullae.  ____________________________________________    LABS (pertinent positives/negatives) (all labs ordered are listed, but only abnormal results are displayed) Labs Reviewed  COMPREHENSIVE METABOLIC PANEL - Abnormal; Notable for the following components:      Result Value   BUN 21 (*)    All other components within normal limits  CBC - Abnormal; Notable for the following components:   WBC 11.0 (*)    All other components within normal limits  URINALYSIS, COMPLETE (UACMP) WITH MICROSCOPIC - Abnormal; Notable for the following components:   Color, Urine STRAW (*)    APPearance CLEAR (*)    All other components within normal limits  SARS CORONAVIRUS 2 (HOSPITAL ORDER, PERFORMED IN [place] [facility])   LIPASE, BLOOD   ____________________________________________   EKG    ____________________________________________    RADIOLOGY  Ct Head Wo Contrast  Result Date: 06/28/2019 CLINICAL DATA:  Acute onset severe headache beginning this morning. Vomiting. EXAM: CT HEAD WITHOUT CONTRAST TECHNIQUE: Contiguous axial images were obtained from the base of the skull through the vertex without intravenous contrast. COMPARISON:  None. FINDINGS: Brain: No evidence of acute infarction, hemorrhage, hydrocephalus, extra-axial collection, or mass lesion/mass effect. Vascular:  No hyperdense vessel or other acute findings. Skull: No evidence of fracture or other significant bone abnormality. Sinuses/Orbits:  No  acute findings. Other: None. IMPRESSION: Negative noncontrast head CT. Electronically Signed   By: Marlaine Hind M.D.   On: 06/28/2019 10:52    ____________________________________________   PROCEDURES Procedures  ____________________________________________  DIFFERENTIAL DIAGNOSIS   Intracranial tumor, intracranial hemorrhage, viral syndrome, COVID, benign headache syndrome.  CLINICAL IMPRESSION / ASSESSMENT AND PLAN / ED COURSE  Medications ordered in the ED: Medications  sodium chloride flush (NS) 0.9 % injection 3 mL (3 mLs Intravenous Not Given 06/28/19 1150)  ketorolac (TORADOL) 30 MG/ML injection 15 mg (15 mg Intravenous Given 06/28/19 1010)  metoCLOPramide (REGLAN) injection 10 mg (10 mg Intravenous Given 06/28/19 1014)  methylPREDNISolone sodium succinate (SOLU-MEDROL) 125 mg/2 mL injection 125 mg (125 mg Intravenous Given 06/28/19 1008)  diphenhydrAMINE (BENADRYL) injection 25 mg (25 mg Intravenous Given 06/28/19 1011)  sodium chloride 0.9 % bolus 1,000 mL (0 mLs Intravenous Stopped 06/28/19 1313)    Pertinent labs & imaging results that were available during my care of the patient were reviewed by me and considered in my medical decision making (see chart for details).  Anthony Richardson was evaluated in Emergency Department on 06/28/2019 for the symptoms described in the history of present illness. He was evaluated in the context of the global COVID-19 pandemic, which necessitated consideration that the patient might be at risk for infection with the SARS-CoV-2 virus that causes COVID-19. Institutional protocols and algorithms that pertain to the evaluation of patients at risk for COVID-19 are in a state of rapid change based on information released by regulatory bodies including the CDC and federal and state organizations. These policies and algorithms were followed during the patient's care in the ED.   Patient presents with a severe headache and multiple constitutional symptoms most suggestive of a viral syndrome.  Doubt meningitis encephalitis intracranial hypertension glaucoma temporal arteritis or ruptured aneurysm.  Doubt stroke.  I will obtain a CT scan of the head for further risk stratification, give migraine cocktail and IV fluids for symptom relief, check COVID test.  ----------------------------------------- 1:16 PM on 06/28/2019 -----------------------------------------  CT head negative, COVID negative, feels better.  Vital signs normal.  Stable for discharge home with supportive care.      ____________________________________________   FINAL CLINICAL IMPRESSION(S) / ED DIAGNOSES    Final diagnoses:  Bad headache     ED Discharge Orders         Ordered    naproxen (NAPROSYN) 500 MG tablet  2 times daily with meals     06/28/19 1316    diphenhydrAMINE (BENADRYL) 25 mg capsule  Every 6 hours PRN     06/28/19 1316    metoCLOPramide (REGLAN) 10 MG tablet  Every 6 hours PRN     06/28/19 1316          Portions of this note were generated with dragon dictation software. Dictation errors may occur despite best attempts at proofreading.   Carrie Mew, MD 06/28/19 1316

## 2019-06-28 NOTE — ED Triage Notes (Signed)
Pt to ED via POV c/o headache that started around 0230, pt states that the pain woke him up from his sleep. Pt was able to go back to sleep and woke up again around 0530 with diarrhea. Pt reports that he has had 3 episodes of loose/watery stools. Pt denies unilateral numbness or weakness. Pt denies hx/o headaches. Pt is in NAD at this time.

## 2019-06-28 NOTE — ED Notes (Signed)
Pt given sample cup for urine

## 2020-10-31 IMAGING — CT CT HEAD W/O CM
3 series · 15 of 47 positions shown, 18 images · non-contrast
Comparison: None.

CLINICAL DATA: Acute onset severe headache beginning this morning.
Vomiting.

EXAM:
CT HEAD WITHOUT CONTRAST
TECHNIQUE: Contiguous axial images were obtained from the base of the skull
through the vertex without intravenous contrast.

[Series 2: head wo · axial · 0.47mm/px · z∈[-141,-16]mm · 9 of 30 slices shown, 12 images]
[im 3/30  brain]
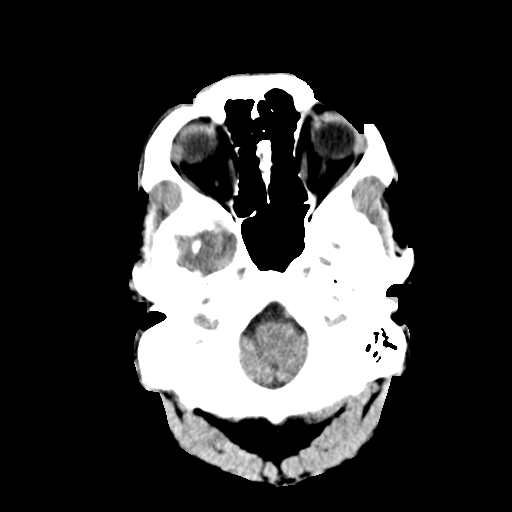
[im 3/30  bone]
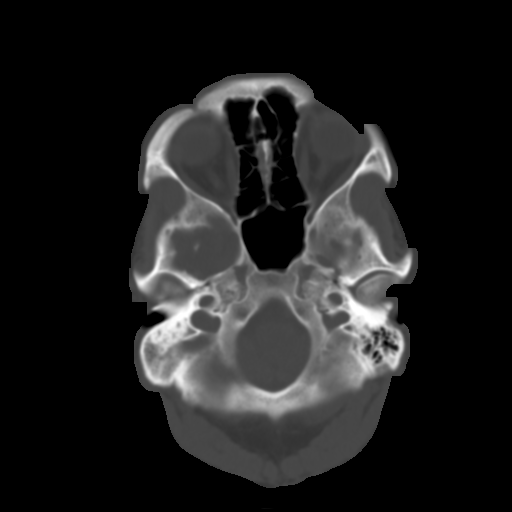
[im 6/30  brain]
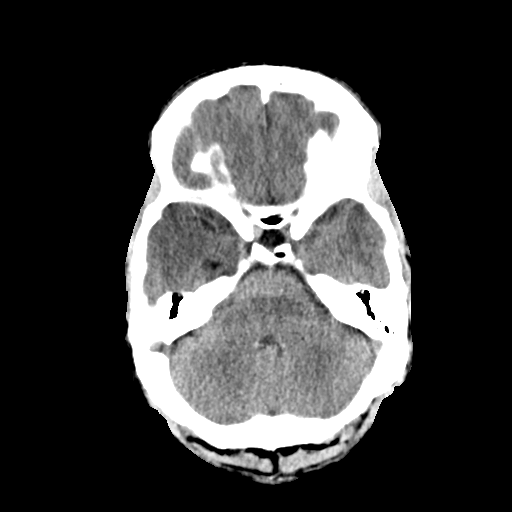
[im 9/30  brain]
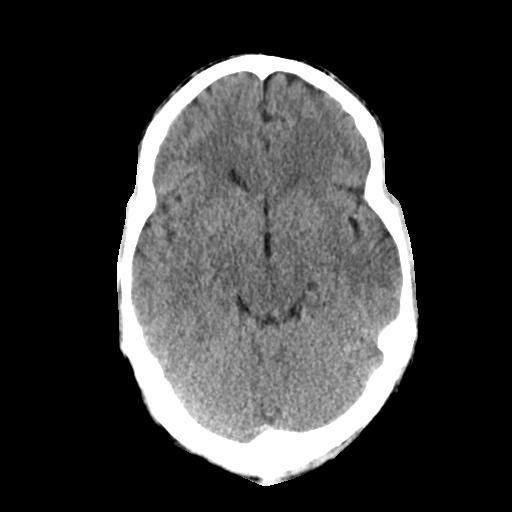
[im 12/30  brain]
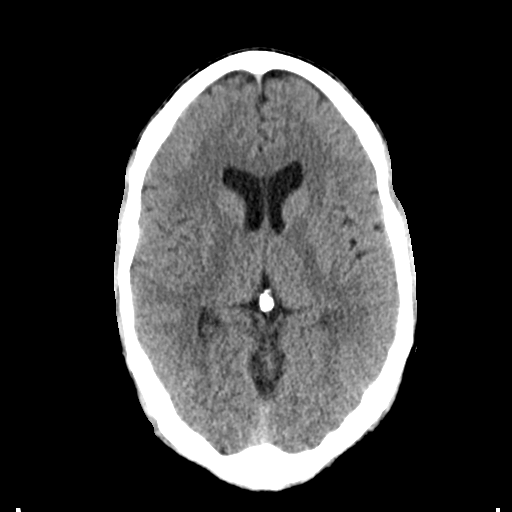
[im 16/30  brain]
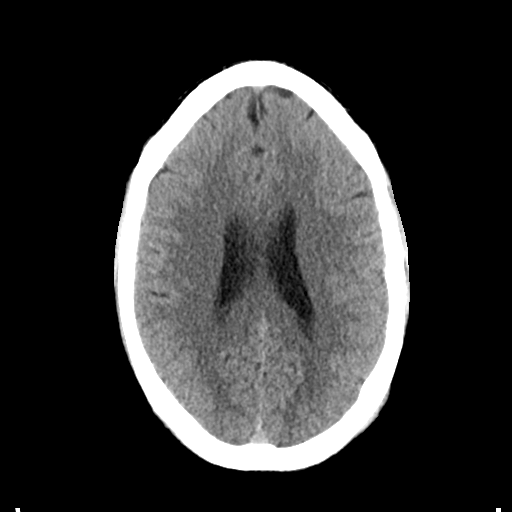
[im 16/30  bone]
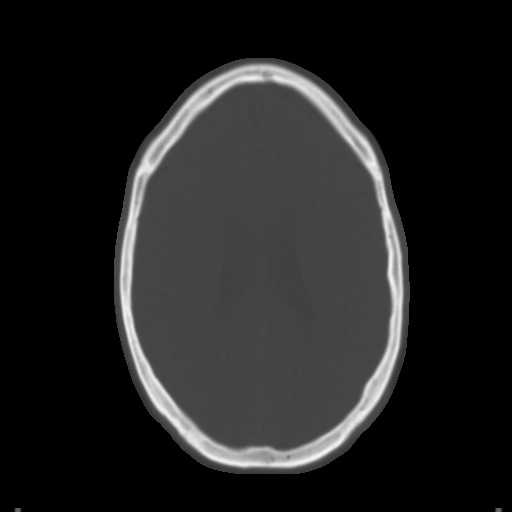
[im 19/30  brain]
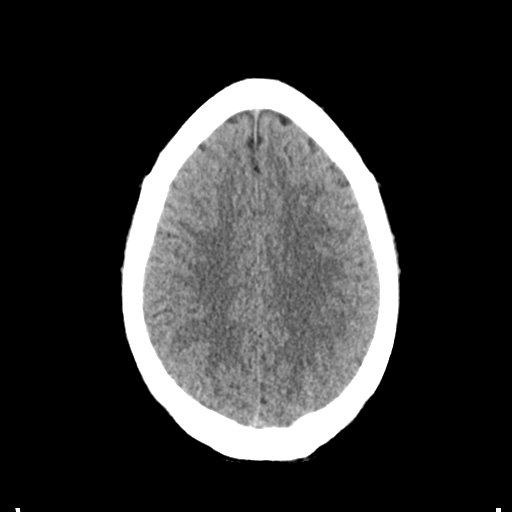
[im 22/30  brain]
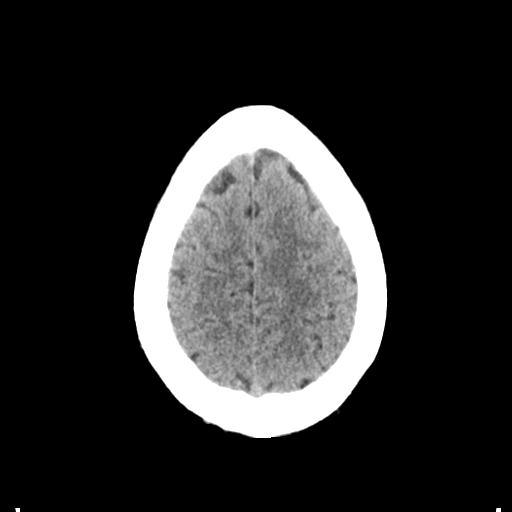
[im 25/30  brain]
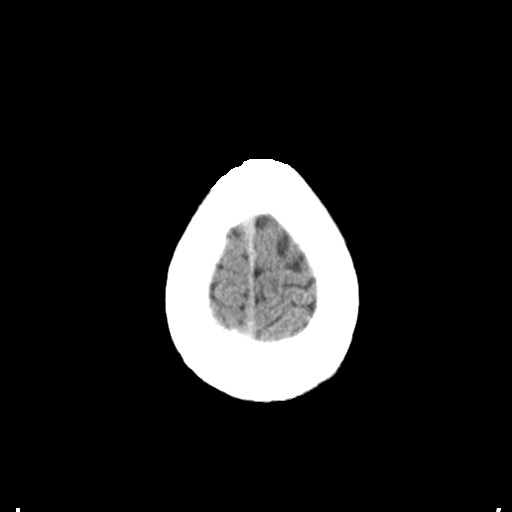
[im 28/30  brain]
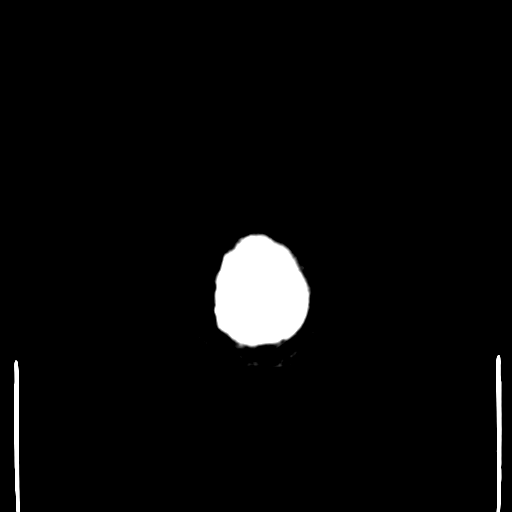
[im 28/30  bone]
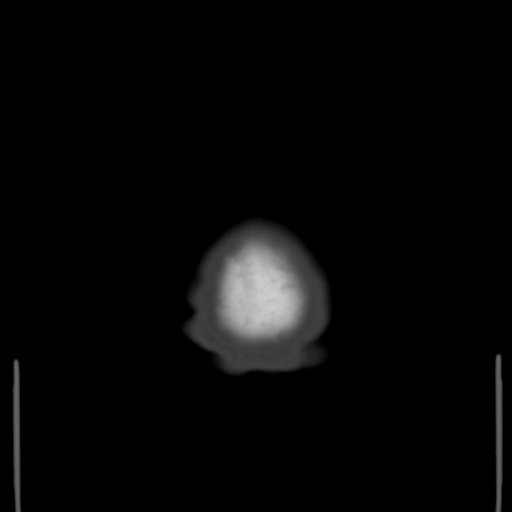

[Series 4: coronal soft tissue · coronal · 0.28mm/px · 3 of 72 slices shown]
[im 24/72  brain]
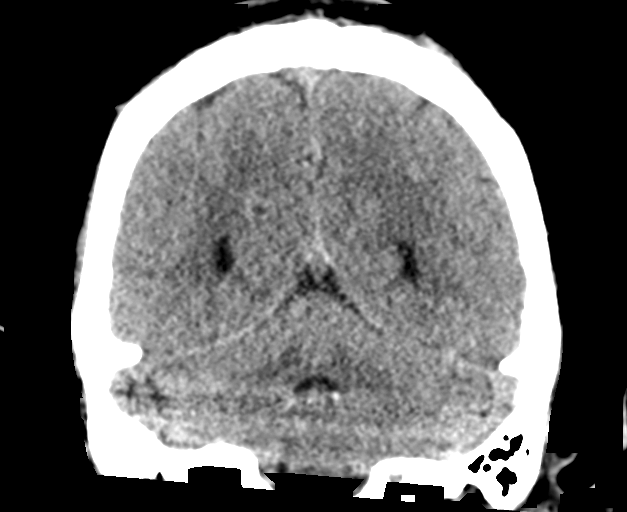
[im 32/72  brain]
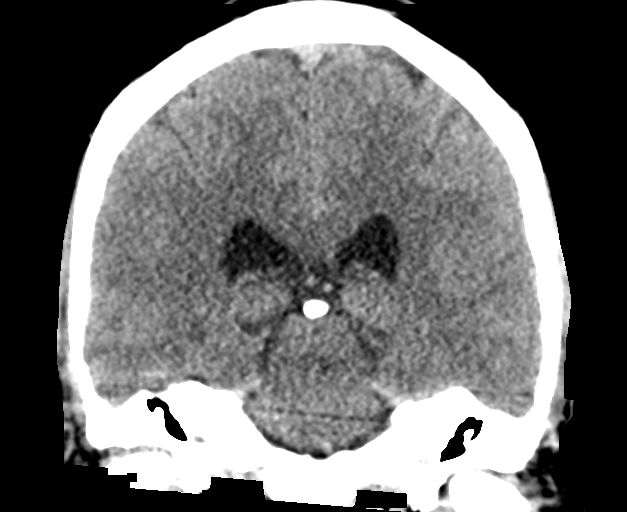
[im 40/72  brain]
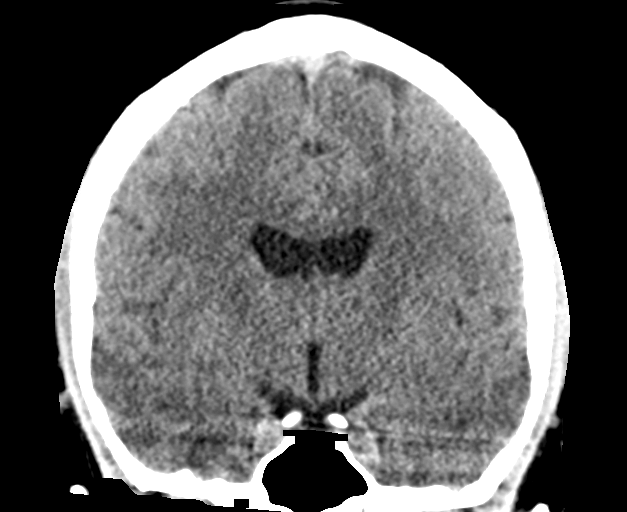

[Series 5: sagittal soft tissue · sagittal · 0.28mm/px · 3 of 59 slices shown]
[im 20/59  brain]
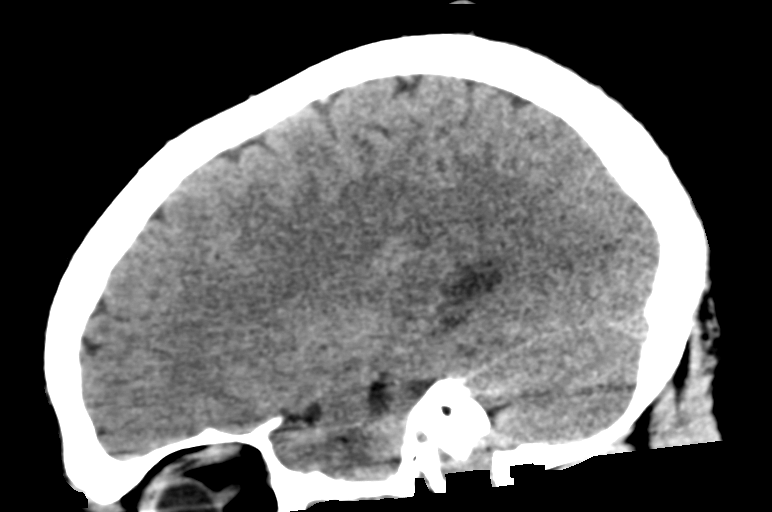
[im 30/59  brain]
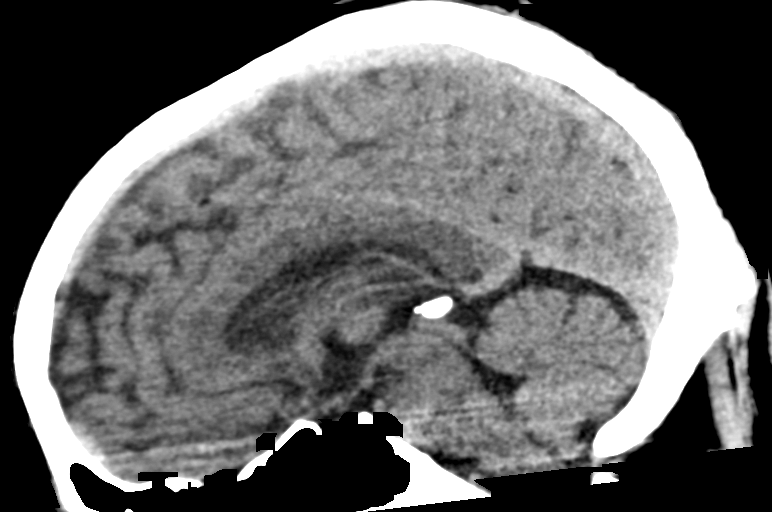
[im 39/59  brain]
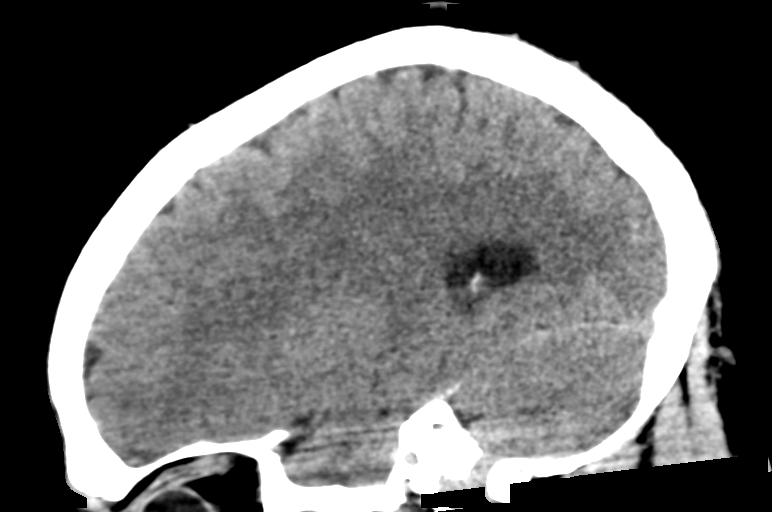

[15 of 47 positions shown; findings below may reference images not displayed]

FINDINGS: Brain: No evidence of acute infarction, hemorrhage, hydrocephalus,
extra-axial collection, or mass lesion/mass effect.

Vascular:  No hyperdense vessel or other acute findings.

Skull: No evidence of fracture or other significant bone
abnormality.

Sinuses/Orbits:  No acute findings.

Other: None.
IMPRESSION: Negative noncontrast head CT.
